# Patient Record
Sex: Male | Born: 1965 | Race: Black or African American | Hispanic: No | Marital: Single
Health system: Southern US, Community
[De-identification: ages and names within clinical notes are randomized; demographics above are authoritative.]

---

## 2004-06-11 ENCOUNTER — Ambulatory Visit: Payer: Self-pay | Admitting: Family Medicine

## 2004-06-16 ENCOUNTER — Ambulatory Visit: Payer: Self-pay | Admitting: Psychiatry

## 2004-06-22 ENCOUNTER — Ambulatory Visit: Payer: Self-pay | Admitting: Psychiatry

## 2006-03-04 ENCOUNTER — Ambulatory Visit: Payer: Self-pay | Admitting: Psychiatry

## 2006-03-05 ENCOUNTER — Ambulatory Visit: Payer: Self-pay | Admitting: Psychiatry

## 2006-12-21 ENCOUNTER — Ambulatory Visit: Payer: Self-pay | Admitting: Urology

## 2007-03-26 ENCOUNTER — Ambulatory Visit: Payer: Self-pay

## 2007-04-25 ENCOUNTER — Ambulatory Visit: Payer: Self-pay | Admitting: Gastroenterology

## 2007-06-12 ENCOUNTER — Other Ambulatory Visit: Payer: Self-pay

## 2007-06-12 ENCOUNTER — Emergency Department: Payer: Self-pay | Admitting: Emergency Medicine

## 2007-06-17 ENCOUNTER — Emergency Department: Payer: Self-pay | Admitting: Emergency Medicine

## 2007-08-06 ENCOUNTER — Emergency Department: Payer: Self-pay | Admitting: Emergency Medicine

## 2007-09-06 ENCOUNTER — Ambulatory Visit: Payer: Self-pay | Admitting: Family Medicine

## 2007-09-14 ENCOUNTER — Inpatient Hospital Stay: Payer: Self-pay | Admitting: Internal Medicine

## 2007-12-31 ENCOUNTER — Ambulatory Visit: Payer: Self-pay | Admitting: Urology

## 2008-01-15 ENCOUNTER — Ambulatory Visit: Payer: Self-pay | Admitting: Urology

## 2009-10-05 ENCOUNTER — Emergency Department: Payer: Self-pay | Admitting: Emergency Medicine

## 2009-11-08 IMAGING — CT CT HEAD WITHOUT CONTRAST
2 series · 15 of 30 positions shown, 19 images · non-contrast
Comparison: none

REASON FOR EXAM: Seizures
COMMENTS:

[Series 2: without · axial · non-contrast · 0.41mm/px · z∈[+672,+792]mm · 13 of 29 slices shown, 17 images]
[im 3/29  brain]
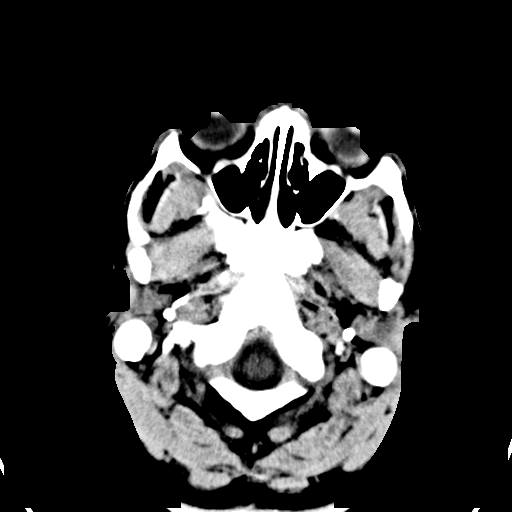
[im 3/29  bone]
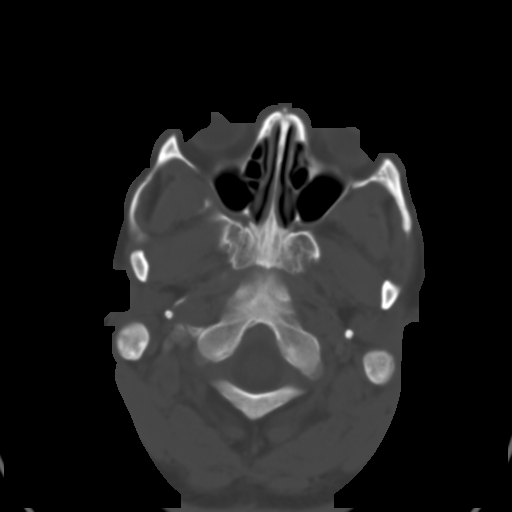
[im 5/29  brain]
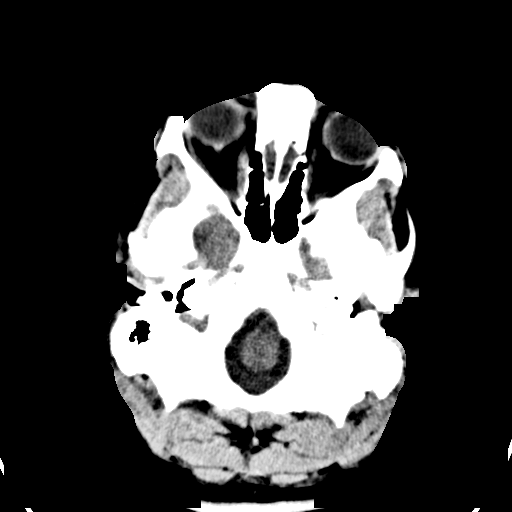
[im 7/29  brain]
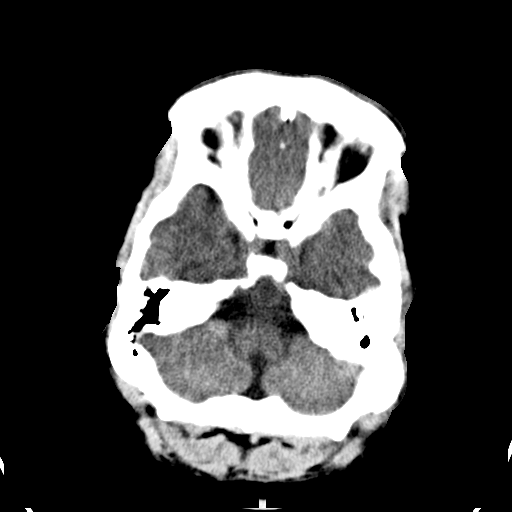
[im 9/29  brain]
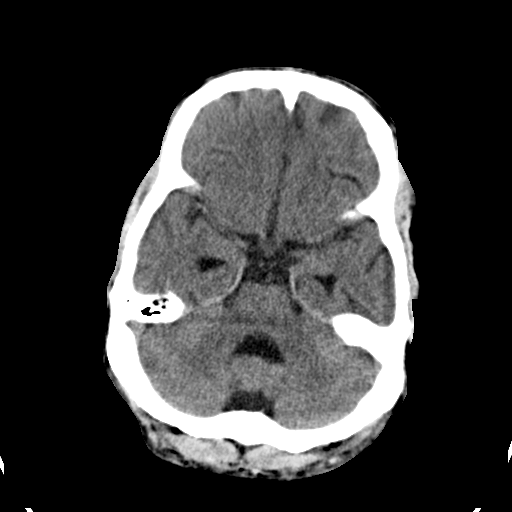
[im 11/29  brain]
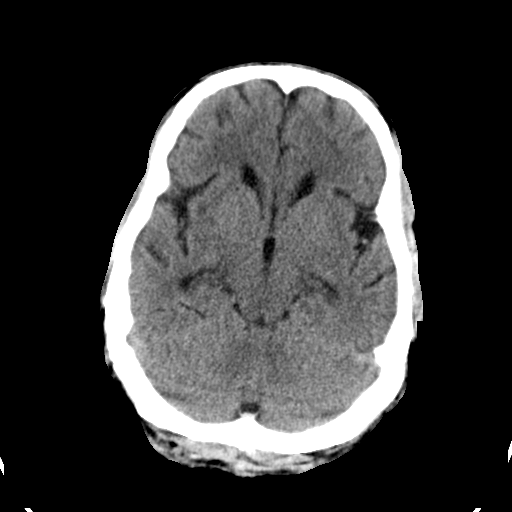
[im 11/29  bone]
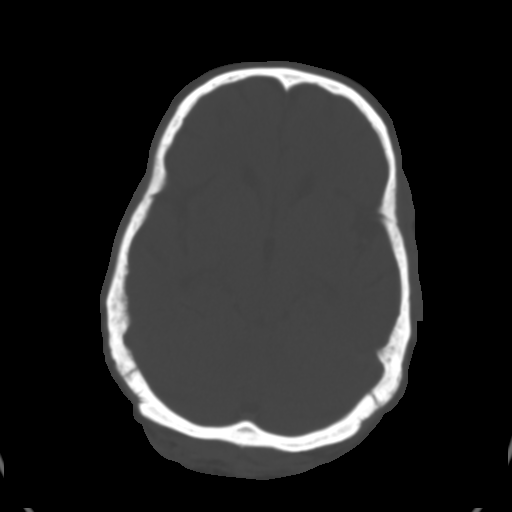
[im 13/29  brain]
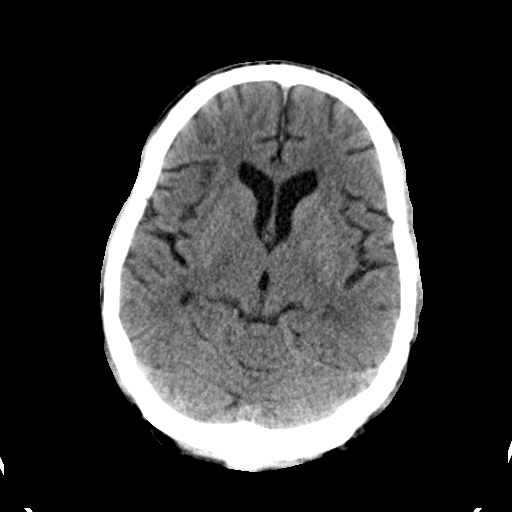
[im 15/29  brain]
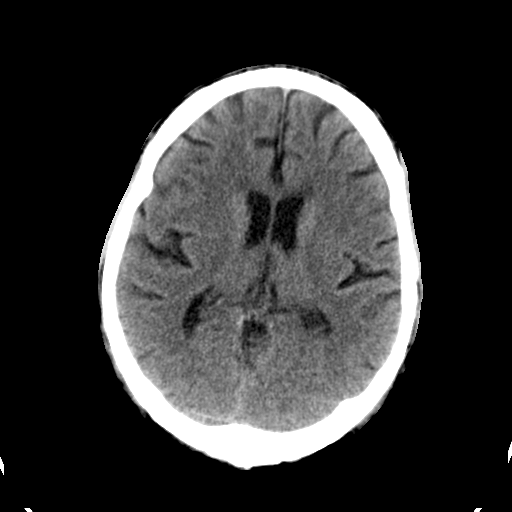
[im 17/29  brain]
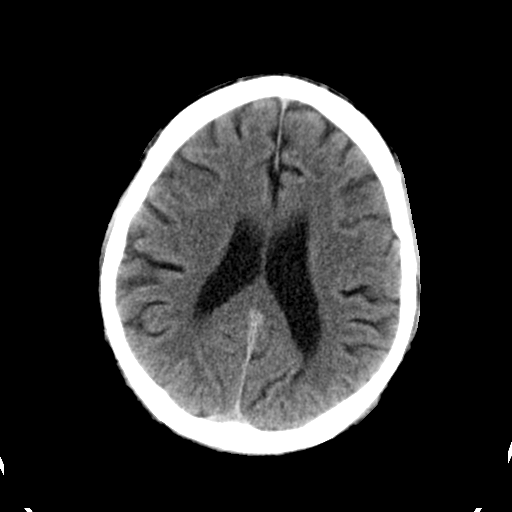
[im 19/29  brain]
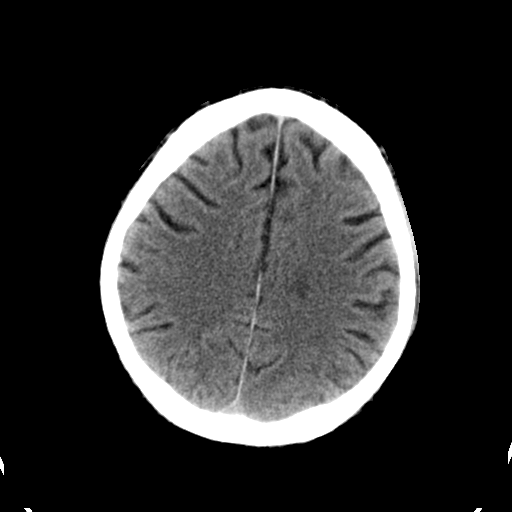
[im 19/29  bone]
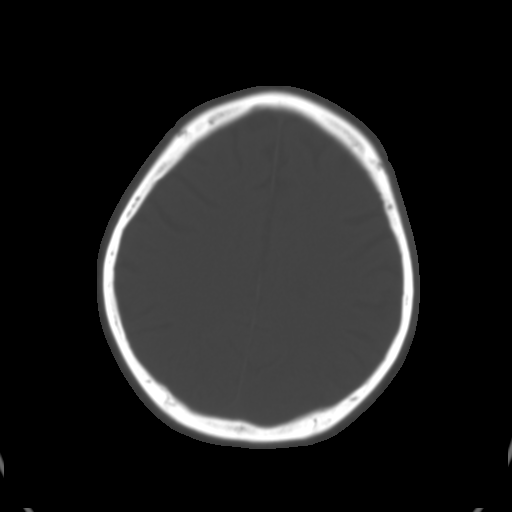
[im 21/29  brain]
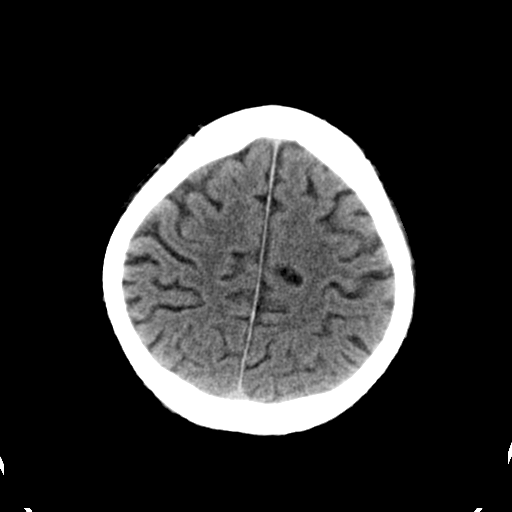
[im 23/29  brain]
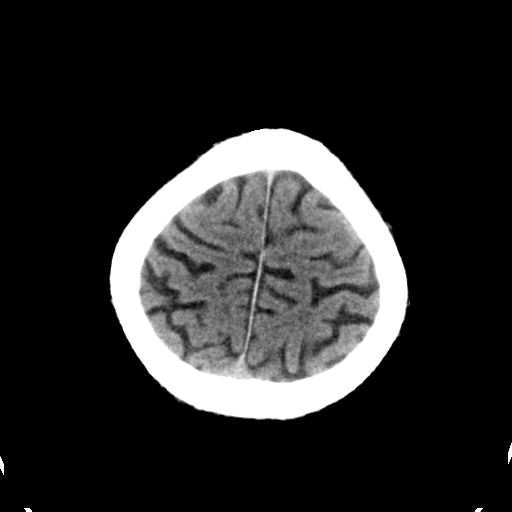
[im 25/29  brain]
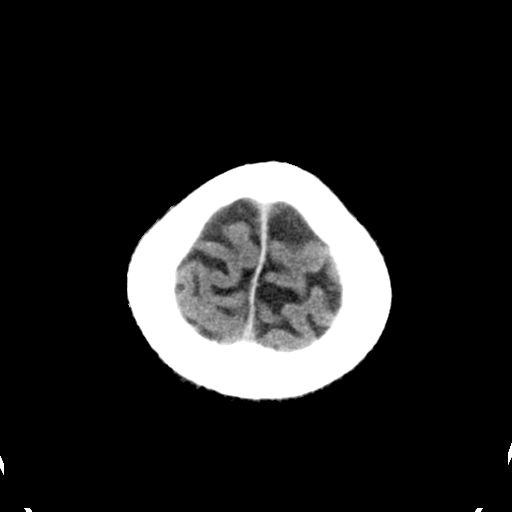
[im 27/29  brain]
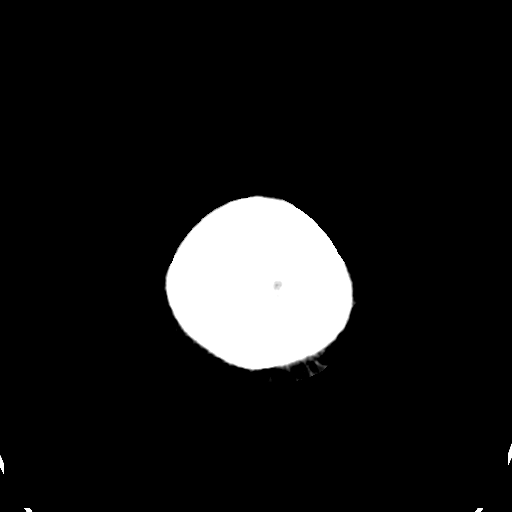
[im 27/29  bone]
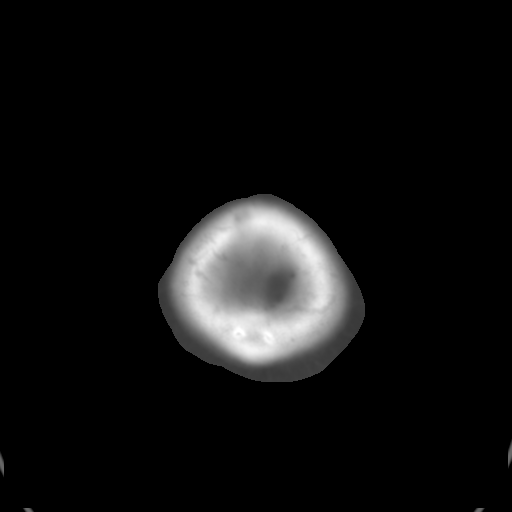

[Series 3: bone · axial · 0.41mm/px · z∈[+672,+692]mm · 2 of 29 slices shown]
[im 3/29  bone]
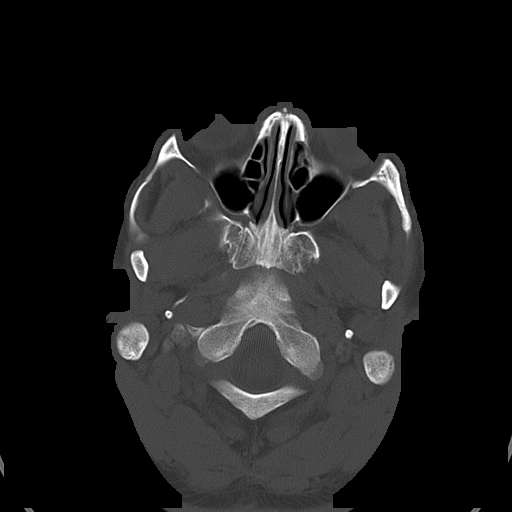
[im 7/29  bone]
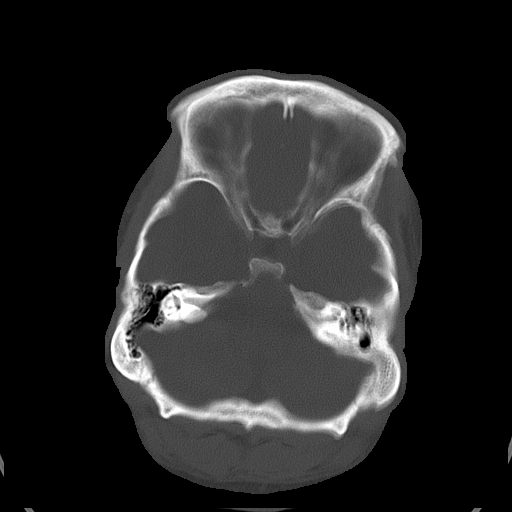

[15 of 30 positions shown; findings below may reference images not displayed]

PROCEDURE:     CT  - CT HEAD WITHOUT CONTRAST  - August 06, 2007 [DATE]

RESULT:     The ventricles are normal in size and position. There is no
evidence of an intracranial hemorrhage, mass or mass effect. There is focal
atrophy in the high LEFT parietal lobe in the paramedian region that is of
uncertain significance. This is unchanged since 06/12/2007. There are no
findings to indicate increased intracranial pressure.

At bone window settings, the calvarium appears intact. There is radiodense
material in the external auditory canal on the RIGHT. The mastoid air cells
on the RIGHT are well pneumatized but on the LEFT they are not nearly as
well pneumatized. The observed portions of the paranasal sinuses are clear.
IMPRESSION: 1.  I do not see evidence of acute intracranial abnormality. Given the
patient's history of seizure disorders, MRI may be a useful next step given
the findings in the high paramedian RIGHT parietal lobe which are not felt
to be acute.
2.  There is radiodense material in the external auditory canal on the RIGHT
which is not a new finding when compared to the study May 2007.
There is decreased aeration of the LEFT mastoid air cells which may be
congenital. There is no evidence of a skull fracture.

A preliminary report was sent to the [HOSPITAL] the conclusion
of the study.

## 2009-12-20 ENCOUNTER — Emergency Department: Payer: Self-pay | Admitting: Emergency Medicine

## 2010-01-13 ENCOUNTER — Emergency Department: Payer: Self-pay | Admitting: Emergency Medicine

## 2011-08-24 ENCOUNTER — Inpatient Hospital Stay: Payer: Self-pay | Admitting: Internal Medicine

## 2011-08-24 LAB — CK TOTAL AND CKMB (NOT AT ARMC): CK-MB: <0.5 ng/mL — ABNORMAL LOW (ref 0.5–3.6)

## 2011-08-24 LAB — COMPREHENSIVE METABOLIC PANEL
Anion Gap: 11 (ref 7–16)
Bilirubin,Total: 0.4 mg/dL (ref 0.2–1.0)
Calcium, Total: 7.9 mg/dL — ABNORMAL LOW (ref 8.5–10.1)
Chloride: 103 mmol/L (ref 98–107)
Co2: 29 mmol/L (ref 21–32)
Creatinine: 0.88 mg/dL (ref 0.60–1.30)
EGFR (African American): >60
EGFR (Non-African Amer.): >60
Osmolality: 287 (ref 275–301)
SGPT (ALT): 31 U/L
Sodium: 143 mmol/L (ref 136–145)

## 2011-08-24 LAB — CBC
HGB: 14 g/dL (ref 13.0–18.0)
MCH: 32.2 pg (ref 26.0–34.0)
MCHC: 32.5 g/dL (ref 32.0–36.0)
MCV: 99 fL (ref 80–100)
RBC: 4.35 10*6/uL — ABNORMAL LOW (ref 4.40–5.90)
RDW: 15.9 % — ABNORMAL HIGH (ref 11.5–14.5)
WBC: 14.4 10*3/uL — ABNORMAL HIGH (ref 3.8–10.6)

## 2011-08-24 LAB — PRO B NATRIURETIC PEPTIDE: B-Type Natriuretic Peptide: 14 pg/mL (ref 0–125)

## 2011-08-25 LAB — CBC WITH DIFFERENTIAL/PLATELET
Basophil #: 0 10*3/uL (ref 0.0–0.1)
Eosinophil %: 1.9 %
HCT: 39.5 % — ABNORMAL LOW (ref 40.0–52.0)
Lymphocyte #: 1 10*3/uL (ref 1.0–3.6)
MCHC: 32.8 g/dL (ref 32.0–36.0)
MCV: 99 fL (ref 80–100)
Neutrophil #: 10.4 10*3/uL — ABNORMAL HIGH (ref 1.4–6.5)
RDW: 15.9 % — ABNORMAL HIGH (ref 11.5–14.5)

## 2011-08-25 LAB — BASIC METABOLIC PANEL
BUN: 8 mg/dL (ref 7–18)
Chloride: 105 mmol/L (ref 98–107)
Co2: 30 mmol/L (ref 21–32)
Creatinine: 0.8 mg/dL (ref 0.60–1.30)
EGFR (Non-African Amer.): >60
Osmolality: 284 (ref 275–301)
Potassium: 3.7 mmol/L (ref 3.5–5.1)

## 2011-08-25 LAB — URINALYSIS, COMPLETE
Bilirubin,UR: NEGATIVE
Blood: NEGATIVE
Ketone: NEGATIVE
Leukocyte Esterase: NEGATIVE
Nitrite: NEGATIVE
Ph: 6 (ref 4.5–8.0)
Specific Gravity: 1.011 (ref 1.003–1.030)
Squamous Epithelial: NONE SEEN

## 2011-08-25 LAB — CK TOTAL AND CKMB (NOT AT ARMC)
CK, Total: 209 U/L (ref 35–232)
CK, Total: 234 U/L — ABNORMAL HIGH (ref 35–232)
CK-MB: <0.5 ng/mL — ABNORMAL LOW (ref 0.5–3.6)
CK-MB: <0.5 ng/mL — ABNORMAL LOW (ref 0.5–3.6)

## 2011-08-25 LAB — TROPONIN I
Troponin-I: <0.02 ng/mL
Troponin-I: <0.02 ng/mL

## 2011-08-26 LAB — BASIC METABOLIC PANEL
Anion Gap: 7 (ref 7–16)
Calcium, Total: 8.2 mg/dL — ABNORMAL LOW (ref 8.5–10.1)
Co2: 29 mmol/L (ref 21–32)
EGFR (African American): >60
EGFR (Non-African Amer.): >60

## 2011-08-26 LAB — CBC WITH DIFFERENTIAL/PLATELET
Basophil %: 0.3 %
Eosinophil #: 0.2 10*3/uL (ref 0.0–0.7)
Eosinophil %: 2.7 %
HCT: 38.2 % — ABNORMAL LOW (ref 40.0–52.0)
HGB: 12.5 g/dL — ABNORMAL LOW (ref 13.0–18.0)
Lymphocyte #: 0.9 10*3/uL — ABNORMAL LOW (ref 1.0–3.6)
MCH: 32.4 pg (ref 26.0–34.0)
MCV: 99 fL (ref 80–100)
Monocyte #: 0.7 10*3/uL (ref 0.0–0.7)
Monocyte %: 8.7 %
Neutrophil #: 5.8 10*3/uL (ref 1.4–6.5)
Neutrophil %: 76.8 %
RBC: 3.87 10*6/uL — ABNORMAL LOW (ref 4.40–5.90)
WBC: 7.6 10*3/uL (ref 3.8–10.6)

## 2011-08-28 LAB — CBC WITH DIFFERENTIAL/PLATELET
Basophil #: 0 10*3/uL (ref 0.0–0.1)
Eosinophil %: 0 %
HCT: 38.6 % — ABNORMAL LOW (ref 40.0–52.0)
Lymphocyte #: 1.3 10*3/uL (ref 1.0–3.6)
Lymphocyte %: 12.3 %
MCV: 98 fL (ref 80–100)
Monocyte #: 0.9 10*3/uL — ABNORMAL HIGH (ref 0.0–0.7)
Monocyte %: 8.4 %
Neutrophil #: 8.6 10*3/uL — ABNORMAL HIGH (ref 1.4–6.5)
Platelet: 281 10*3/uL (ref 150–440)
RBC: 3.95 10*6/uL — ABNORMAL LOW (ref 4.40–5.90)
WBC: 10.9 10*3/uL — ABNORMAL HIGH (ref 3.8–10.6)

## 2011-08-28 LAB — BASIC METABOLIC PANEL
BUN: 11 mg/dL (ref 7–18)
Calcium, Total: 8.3 mg/dL — ABNORMAL LOW (ref 8.5–10.1)
Co2: 23 mmol/L (ref 21–32)
EGFR (African American): >60
EGFR (Non-African Amer.): >60
Glucose: 113 mg/dL — ABNORMAL HIGH (ref 65–99)
Osmolality: 289 (ref 275–301)
Sodium: 145 mmol/L (ref 136–145)

## 2011-12-17 ENCOUNTER — Emergency Department: Payer: Self-pay | Admitting: *Deleted

## 2011-12-17 LAB — CBC
HCT: 45.9 % (ref 40.0–52.0)
HGB: 14.6 g/dL (ref 13.0–18.0)
MCH: 31.9 pg (ref 26.0–34.0)
MCV: 100 fL (ref 80–100)
Platelet: 238 10*3/uL (ref 150–440)
WBC: 6.3 10*3/uL (ref 3.8–10.6)

## 2011-12-17 LAB — URINALYSIS, COMPLETE
Bilirubin,UR: NEGATIVE
Blood: NEGATIVE
Glucose,UR: NEGATIVE mg/dL (ref 0–75)
Ketone: NEGATIVE
Leukocyte Esterase: NEGATIVE
Ph: 6 (ref 4.5–8.0)
RBC,UR: NONE SEEN /HPF (ref 0–5)
Specific Gravity: 1.028 (ref 1.003–1.030)
Squamous Epithelial: NONE SEEN
WBC UR: 1 /HPF (ref 0–5)

## 2011-12-17 LAB — COMPREHENSIVE METABOLIC PANEL
Alkaline Phosphatase: 65 U/L (ref 50–136)
BUN: 6 mg/dL — ABNORMAL LOW (ref 7–18)
Calcium, Total: 8.5 mg/dL (ref 8.5–10.1)
Creatinine: 0.72 mg/dL (ref 0.60–1.30)
EGFR (African American): >60
Potassium: 3.9 mmol/L (ref 3.5–5.1)
SGOT(AST): 30 U/L (ref 15–37)
SGPT (ALT): 41 U/L
Sodium: 142 mmol/L (ref 136–145)

## 2012-02-27 ENCOUNTER — Inpatient Hospital Stay: Payer: Self-pay | Admitting: Internal Medicine

## 2012-02-27 LAB — URINALYSIS, COMPLETE
Bilirubin,UR: NEGATIVE
Glucose,UR: NEGATIVE mg/dL (ref 0–75)
Ketone: NEGATIVE
Protein: NEGATIVE
RBC,UR: 5 /HPF (ref 0–5)
Renal Epithelial: 7
Squamous Epithelial: NONE SEEN
WBC UR: 1 /HPF (ref 0–5)

## 2012-02-27 LAB — CBC
HCT: 44.3 % (ref 40.0–52.0)
HGB: 14.5 g/dL (ref 13.0–18.0)
MCH: 32.3 pg (ref 26.0–34.0)
MCHC: 32.7 g/dL (ref 32.0–36.0)
MCV: 99 fL (ref 80–100)
RBC: 4.49 10*6/uL (ref 4.40–5.90)
RDW: 15.9 % — ABNORMAL HIGH (ref 11.5–14.5)
WBC: 7.5 10*3/uL (ref 3.8–10.6)

## 2012-02-27 LAB — COMPREHENSIVE METABOLIC PANEL
Albumin: 3 g/dL — ABNORMAL LOW (ref 3.4–5.0)
BUN: 11 mg/dL (ref 7–18)
EGFR (African American): >60
EGFR (Non-African Amer.): >60
Glucose: 94 mg/dL (ref 65–99)
SGOT(AST): 20 U/L (ref 15–37)
SGPT (ALT): 17 U/L
Total Protein: 8.3 g/dL — ABNORMAL HIGH (ref 6.4–8.2)

## 2012-02-27 LAB — TSH: Thyroid Stimulating Horm: 78.9 u[IU]/mL — ABNORMAL HIGH

## 2012-02-27 LAB — PRO B NATRIURETIC PEPTIDE: B-Type Natriuretic Peptide: 10 pg/mL (ref 0–125)

## 2012-02-28 LAB — CBC WITH DIFFERENTIAL/PLATELET
Basophil #: 0 10*3/uL (ref 0.0–0.1)
Lymphocyte %: 13 %
MCH: 32.3 pg (ref 26.0–34.0)
MCHC: 33.1 g/dL (ref 32.0–36.0)
Monocyte #: 0.5 x10 3/mm (ref 0.2–1.0)
Monocyte %: 8 %
Neutrophil #: 5.1 10*3/uL (ref 1.4–6.5)
Neutrophil %: 77.9 %
RDW: 15.6 % — ABNORMAL HIGH (ref 11.5–14.5)

## 2012-02-28 LAB — COMPREHENSIVE METABOLIC PANEL
Alkaline Phosphatase: 92 U/L (ref 50–136)
Anion Gap: 6 — ABNORMAL LOW (ref 7–16)
BUN: 9 mg/dL (ref 7–18)
Bilirubin,Total: 1 mg/dL (ref 0.2–1.0)
Creatinine: 0.96 mg/dL (ref 0.60–1.30)
EGFR (African American): >60
EGFR (Non-African Amer.): >60
Glucose: 122 mg/dL — ABNORMAL HIGH (ref 65–99)
SGOT(AST): 18 U/L (ref 15–37)
SGPT (ALT): 16 U/L
Total Protein: 8.4 g/dL — ABNORMAL HIGH (ref 6.4–8.2)

## 2012-02-28 LAB — URINALYSIS, COMPLETE
Leukocyte Esterase: NEGATIVE
Nitrite: NEGATIVE
Ph: 7 (ref 4.5–8.0)
Protein: NEGATIVE
Specific Gravity: 1.005 (ref 1.003–1.030)
WBC UR: 4 /HPF (ref 0–5)

## 2012-02-28 LAB — TROPONIN I
Troponin-I: <0.02 ng/mL
Troponin-I: <0.02 ng/mL

## 2012-02-29 LAB — POTASSIUM: Potassium: 3 mmol/L — ABNORMAL LOW

## 2012-02-29 LAB — URINE CULTURE

## 2012-03-01 LAB — POTASSIUM: Potassium: 3 mmol/L — ABNORMAL LOW (ref 3.5–5.1)

## 2012-03-04 LAB — CULTURE, BLOOD (SINGLE)

## 2012-08-22 ENCOUNTER — Emergency Department: Payer: Self-pay | Admitting: Emergency Medicine

## 2012-08-22 LAB — CBC
HCT: 42.3 % (ref 40.0–52.0)
HGB: 13.9 g/dL (ref 13.0–18.0)
MCH: 31.3 pg (ref 26.0–34.0)
MCV: 96 fL (ref 80–100)
Platelet: 307 10*3/uL (ref 150–440)
RBC: 4.42 10*6/uL (ref 4.40–5.90)
RDW: 15.9 % — ABNORMAL HIGH (ref 11.5–14.5)

## 2012-08-22 LAB — COMPREHENSIVE METABOLIC PANEL
Albumin: 2.6 g/dL — ABNORMAL LOW (ref 3.4–5.0)
Alkaline Phosphatase: 94 U/L (ref 50–136)
Anion Gap: 10 (ref 7–16)
BUN: 6 mg/dL — ABNORMAL LOW (ref 7–18)
Chloride: 108 mmol/L — ABNORMAL HIGH (ref 98–107)
Co2: 23 mmol/L (ref 21–32)
EGFR (African American): >60
Glucose: 94 mg/dL (ref 65–99)
Osmolality: 279 (ref 275–301)
SGOT(AST): 21 U/L (ref 15–37)
Sodium: 141 mmol/L (ref 136–145)

## 2012-08-22 LAB — PRO B NATRIURETIC PEPTIDE: B-Type Natriuretic Peptide: 5 pg/mL (ref 0–125)

## 2012-08-22 LAB — CK TOTAL AND CKMB (NOT AT ARMC): CK-MB: <0.5 ng/mL — ABNORMAL LOW (ref 0.5–3.6)

## 2013-09-05 ENCOUNTER — Observation Stay: Payer: Self-pay | Admitting: Internal Medicine

## 2013-09-05 LAB — URINALYSIS, COMPLETE
BLOOD: NEGATIVE
Bacteria: NONE SEEN
Bilirubin,UR: NEGATIVE
Glucose,UR: NEGATIVE mg/dL (ref 0–75)
Hyaline Cast: 1
Ketone: NEGATIVE
Leukocyte Esterase: NEGATIVE
NITRITE: NEGATIVE
PH: 6 (ref 4.5–8.0)
PROTEIN: NEGATIVE
RBC,UR: <1 /HPF (ref 0–5)
Specific Gravity: 1.012 (ref 1.003–1.030)
Squamous Epithelial: NONE SEEN
WBC UR: <1 /HPF (ref 0–5)

## 2013-09-05 LAB — COMPREHENSIVE METABOLIC PANEL
AST: 21 U/L (ref 15–37)
Albumin: 2.5 g/dL — ABNORMAL LOW (ref 3.4–5.0)
Alkaline Phosphatase: 87 U/L
Anion Gap: 2 — ABNORMAL LOW (ref 7–16)
BILIRUBIN TOTAL: 0.3 mg/dL (ref 0.2–1.0)
BUN: 7 mg/dL (ref 7–18)
CALCIUM: 8 mg/dL — AB (ref 8.5–10.1)
Chloride: 102 mmol/L (ref 98–107)
Co2: 30 mmol/L (ref 21–32)
Creatinine: 0.9 mg/dL (ref 0.60–1.30)
EGFR (African American): >60
Glucose: 73 mg/dL (ref 65–99)
OSMOLALITY: 265 (ref 275–301)
POTASSIUM: 3.7 mmol/L (ref 3.5–5.1)
SGPT (ALT): 16 U/L (ref 12–78)
Sodium: 134 mmol/L — ABNORMAL LOW (ref 136–145)
Total Protein: 7.9 g/dL (ref 6.4–8.2)

## 2013-09-05 LAB — CBC
HCT: 47 % (ref 40.0–52.0)
HGB: 15.3 g/dL (ref 13.0–18.0)
MCH: 31.6 pg (ref 26.0–34.0)
MCHC: 32.6 g/dL (ref 32.0–36.0)
MCV: 97 fL (ref 80–100)
Platelet: 285 10*3/uL (ref 150–440)
RBC: 4.84 10*6/uL (ref 4.40–5.90)
RDW: 15.6 % — AB (ref 11.5–14.5)
WBC: 9 10*3/uL (ref 3.8–10.6)

## 2013-09-05 LAB — PRO B NATRIURETIC PEPTIDE: B-Type Natriuretic Peptide: <5 pg/mL (ref 0–125)

## 2013-09-05 LAB — TROPONIN I: Troponin-I: <0.02 ng/mL

## 2013-09-06 LAB — CBC WITH DIFFERENTIAL/PLATELET
Basophil #: 0.1 10*3/uL (ref 0.0–0.1)
Basophil %: 1.1 %
EOS ABS: 0.1 10*3/uL (ref 0.0–0.7)
EOS PCT: 1.6 %
HCT: 40.2 % (ref 40.0–52.0)
HGB: 13.2 g/dL (ref 13.0–18.0)
LYMPHS ABS: 1 10*3/uL (ref 1.0–3.6)
LYMPHS PCT: 20.1 %
MCH: 31.6 pg (ref 26.0–34.0)
MCHC: 32.9 g/dL (ref 32.0–36.0)
MCV: 96 fL (ref 80–100)
MONO ABS: 0.6 x10 3/mm (ref 0.2–1.0)
Monocyte %: 11.1 %
Neutrophil #: 3.4 10*3/uL (ref 1.4–6.5)
Neutrophil %: 66.1 %
PLATELETS: 265 10*3/uL (ref 150–440)
RBC: 4.19 10*6/uL — ABNORMAL LOW (ref 4.40–5.90)
RDW: 15.4 % — AB (ref 11.5–14.5)
WBC: 5.1 10*3/uL (ref 3.8–10.6)

## 2013-09-06 LAB — BASIC METABOLIC PANEL
Anion Gap: 3 — ABNORMAL LOW (ref 7–16)
BUN: 7 mg/dL (ref 7–18)
CALCIUM: 8.1 mg/dL — AB (ref 8.5–10.1)
Chloride: 107 mmol/L (ref 98–107)
Co2: 29 mmol/L (ref 21–32)
Creatinine: 0.71 mg/dL (ref 0.60–1.30)
EGFR (African American): >60
Glucose: 77 mg/dL (ref 65–99)
OSMOLALITY: 274 (ref 275–301)
POTASSIUM: 3.9 mmol/L (ref 3.5–5.1)
SODIUM: 139 mmol/L (ref 136–145)

## 2013-09-06 LAB — MAGNESIUM: MAGNESIUM: 2 mg/dL

## 2013-09-06 LAB — TSH: Thyroid Stimulating Horm: 17.6 u[IU]/mL — ABNORMAL HIGH

## 2013-09-06 LAB — T4, FREE: FREE THYROXINE: 0.91 ng/dL (ref 0.76–1.46)

## 2013-09-10 LAB — CULTURE, BLOOD (SINGLE)

## 2013-09-11 LAB — CULTURE, BLOOD (SINGLE)

## 2013-09-28 ENCOUNTER — Inpatient Hospital Stay: Payer: Self-pay | Admitting: Internal Medicine

## 2013-09-28 LAB — CBC WITH DIFFERENTIAL/PLATELET
Basophil #: 0.1 10*3/uL (ref 0.0–0.1)
Basophil %: 0.7 %
Eosinophil #: 0 10*3/uL (ref 0.0–0.7)
Eosinophil %: 0.2 %
HCT: 45.8 % (ref 40.0–52.0)
HGB: 14.5 g/dL (ref 13.0–18.0)
Lymphocyte #: 0.9 10*3/uL — ABNORMAL LOW (ref 1.0–3.6)
Lymphocyte %: 10.1 %
MCH: 30.9 pg (ref 26.0–34.0)
MCHC: 31.7 g/dL — ABNORMAL LOW (ref 32.0–36.0)
MCV: 97 fL (ref 80–100)
Monocyte #: 0.6 x10 3/mm (ref 0.2–1.0)
Monocyte %: 6.9 %
NEUTROS ABS: 7.7 10*3/uL — AB (ref 1.4–6.5)
Neutrophil %: 82.1 %
Platelet: 302 10*3/uL (ref 150–440)
RBC: 4.7 10*6/uL (ref 4.40–5.90)
RDW: 15.6 % — AB (ref 11.5–14.5)
WBC: 9.3 10*3/uL (ref 3.8–10.6)

## 2013-09-28 LAB — URINALYSIS, COMPLETE
BACTERIA: NEGATIVE
Bilirubin,UR: NEGATIVE
Blood: NEGATIVE
GLUCOSE, UR: NEGATIVE mg/dL (ref 0–75)
Ketone: NEGATIVE
Leukocyte Esterase: NEGATIVE
Nitrite: NEGATIVE
Ph: 7 (ref 4.5–8.0)
Protein: NEGATIVE
RBC,UR: NONE SEEN /HPF (ref 0–5)
SPECIFIC GRAVITY: 1.016 (ref 1.003–1.030)
WBC UR: NONE SEEN /HPF (ref 0–5)

## 2013-09-28 LAB — COMPREHENSIVE METABOLIC PANEL
ALK PHOS: 103 U/L
Albumin: 2.5 g/dL — ABNORMAL LOW (ref 3.4–5.0)
Anion Gap: 5 — ABNORMAL LOW (ref 7–16)
BUN: 4 mg/dL — ABNORMAL LOW (ref 7–18)
Bilirubin,Total: 0.2 mg/dL (ref 0.2–1.0)
CALCIUM: 8.3 mg/dL — AB (ref 8.5–10.1)
CREATININE: 0.8 mg/dL (ref 0.60–1.30)
Chloride: 104 mmol/L (ref 98–107)
Co2: 30 mmol/L (ref 21–32)
EGFR (African American): >60
EGFR (Non-African Amer.): >60
GLUCOSE: 94 mg/dL (ref 65–99)
OSMOLALITY: 274 (ref 275–301)
Potassium: 3.6 mmol/L (ref 3.5–5.1)
SGOT(AST): 9 U/L — ABNORMAL LOW (ref 15–37)
SGPT (ALT): 11 U/L — ABNORMAL LOW (ref 12–78)
SODIUM: 139 mmol/L (ref 136–145)
TOTAL PROTEIN: 8 g/dL (ref 6.4–8.2)

## 2013-09-28 LAB — ETHANOL: Ethanol %: <0.003 % (ref 0.000–0.080)

## 2013-09-28 LAB — AMMONIA: Ammonia, Plasma: 43 mcmol/L — ABNORMAL HIGH (ref 11–32)

## 2013-09-28 LAB — DRUG SCREEN, URINE
Amphetamines, Ur Screen: NEGATIVE (ref ?–1000)
BARBITURATES, UR SCREEN: NEGATIVE (ref ?–200)
BENZODIAZEPINE, UR SCRN: NEGATIVE (ref ?–200)
Cannabinoid 50 Ng, Ur ~~LOC~~: NEGATIVE (ref ?–50)
Cocaine Metabolite,Ur ~~LOC~~: NEGATIVE (ref ?–300)
MDMA (Ecstasy)Ur Screen: POSITIVE (ref ?–500)
METHADONE, UR SCREEN: NEGATIVE (ref ?–300)
Opiate, Ur Screen: NEGATIVE (ref ?–300)
Phencyclidine (PCP) Ur S: NEGATIVE (ref ?–25)
Tricyclic, Ur Screen: NEGATIVE (ref ?–1000)

## 2013-09-28 LAB — MAGNESIUM: MAGNESIUM: 2.2 mg/dL

## 2013-09-28 LAB — LIPASE, BLOOD: Lipase: 78 U/L (ref 73–393)

## 2013-09-28 LAB — TROPONIN I: Troponin-I: <0.02 ng/mL

## 2013-09-29 LAB — CBC WITH DIFFERENTIAL/PLATELET
Basophil #: 0 10*3/uL (ref 0.0–0.1)
Basophil %: 0.4 %
Eosinophil #: 0 10*3/uL (ref 0.0–0.7)
Eosinophil %: 0.1 %
HCT: 46.1 % (ref 40.0–52.0)
HGB: 14.8 g/dL (ref 13.0–18.0)
LYMPHS PCT: 6.5 %
Lymphocyte #: 0.8 10*3/uL — ABNORMAL LOW (ref 1.0–3.6)
MCH: 31.5 pg (ref 26.0–34.0)
MCHC: 32.2 g/dL (ref 32.0–36.0)
MCV: 98 fL (ref 80–100)
MONO ABS: 0.7 x10 3/mm (ref 0.2–1.0)
MONOS PCT: 6 %
Neutrophil #: 10.6 10*3/uL — ABNORMAL HIGH (ref 1.4–6.5)
Neutrophil %: 87 %
PLATELETS: 317 10*3/uL (ref 150–440)
RBC: 4.71 10*6/uL (ref 4.40–5.90)
RDW: 15.5 % — ABNORMAL HIGH (ref 11.5–14.5)
WBC: 12.2 10*3/uL — AB (ref 3.8–10.6)

## 2013-09-29 LAB — CK-MB: CK-MB: <0.5 ng/mL — ABNORMAL LOW (ref 0.5–3.6)

## 2013-09-29 LAB — COMPREHENSIVE METABOLIC PANEL
ALK PHOS: 103 U/L
ANION GAP: 4 — AB (ref 7–16)
AST: 13 U/L — AB (ref 15–37)
Albumin: 2.6 g/dL — ABNORMAL LOW (ref 3.4–5.0)
BUN: 4 mg/dL — ABNORMAL LOW (ref 7–18)
Bilirubin,Total: 0.5 mg/dL (ref 0.2–1.0)
CHLORIDE: 103 mmol/L (ref 98–107)
Calcium, Total: 8.5 mg/dL (ref 8.5–10.1)
Co2: 30 mmol/L (ref 21–32)
Creatinine: 0.88 mg/dL (ref 0.60–1.30)
EGFR (Non-African Amer.): >60
GLUCOSE: 115 mg/dL — AB (ref 65–99)
OSMOLALITY: 272 (ref 275–301)
Potassium: 3.4 mmol/L — ABNORMAL LOW (ref 3.5–5.1)
SGPT (ALT): 10 U/L — ABNORMAL LOW (ref 12–78)
SODIUM: 137 mmol/L (ref 136–145)
Total Protein: 8.3 g/dL — ABNORMAL HIGH (ref 6.4–8.2)

## 2013-09-29 LAB — TROPONIN I
Troponin-I: <0.02 ng/mL
Troponin-I: <0.02 ng/mL

## 2013-10-03 LAB — CULTURE, BLOOD (SINGLE)

## 2013-10-03 LAB — PLATELET COUNT: Platelet: 258 10*3/uL (ref 150–440)

## 2013-10-05 LAB — CREATININE, SERUM
Creatinine: 0.95 mg/dL (ref 0.60–1.30)
EGFR (African American): >60
EGFR (Non-African Amer.): >60

## 2013-10-07 ENCOUNTER — Emergency Department: Payer: Self-pay | Admitting: Emergency Medicine

## 2013-10-07 LAB — DRUG SCREEN, URINE
Amphetamines, Ur Screen: NEGATIVE (ref ?–1000)
Barbiturates, Ur Screen: NEGATIVE (ref ?–200)
Benzodiazepine, Ur Scrn: POSITIVE (ref ?–200)
CANNABINOID 50 NG, UR ~~LOC~~: NEGATIVE (ref ?–50)
Cocaine Metabolite,Ur ~~LOC~~: NEGATIVE (ref ?–300)
MDMA (ECSTASY) UR SCREEN: POSITIVE (ref ?–500)
METHADONE, UR SCREEN: NEGATIVE (ref ?–300)
Opiate, Ur Screen: POSITIVE (ref ?–300)
Phencyclidine (PCP) Ur S: NEGATIVE (ref ?–25)
TRICYCLIC, UR SCREEN: NEGATIVE (ref ?–1000)

## 2013-10-07 LAB — COMPREHENSIVE METABOLIC PANEL
ALK PHOS: 62 U/L
ANION GAP: 3 — AB (ref 7–16)
Albumin: 2.6 g/dL — ABNORMAL LOW (ref 3.4–5.0)
BUN: 9 mg/dL (ref 7–18)
Bilirubin,Total: 0.5 mg/dL (ref 0.2–1.0)
CALCIUM: 8.6 mg/dL (ref 8.5–10.1)
Chloride: 107 mmol/L (ref 98–107)
Co2: 28 mmol/L (ref 21–32)
Creatinine: 0.65 mg/dL (ref 0.60–1.30)
GLUCOSE: 76 mg/dL (ref 65–99)
Osmolality: 273 (ref 275–301)
Potassium: 5.1 mmol/L (ref 3.5–5.1)
SGOT(AST): 45 U/L — ABNORMAL HIGH (ref 15–37)
SGPT (ALT): 17 U/L (ref 12–78)
Sodium: 138 mmol/L (ref 136–145)
TOTAL PROTEIN: 8.5 g/dL — AB (ref 6.4–8.2)

## 2013-10-07 LAB — URINALYSIS, COMPLETE
Bacteria: NONE SEEN
Glucose,UR: NEGATIVE mg/dL (ref 0–75)
KETONE: NEGATIVE
Leukocyte Esterase: NEGATIVE
Nitrite: NEGATIVE
Ph: 6 (ref 4.5–8.0)
Protein: NEGATIVE
Specific Gravity: 1.026 (ref 1.003–1.030)
Squamous Epithelial: <1
WBC UR: 2 /HPF (ref 0–5)

## 2013-10-07 LAB — CBC WITH DIFFERENTIAL/PLATELET
BASOS ABS: 0.1 10*3/uL (ref 0.0–0.1)
Basophil %: 1.7 %
EOS ABS: 0.1 10*3/uL (ref 0.0–0.7)
EOS PCT: 2.1 %
HCT: 44.5 % (ref 40.0–52.0)
HGB: 14.8 g/dL (ref 13.0–18.0)
LYMPHS PCT: 26.3 %
Lymphocyte #: 1.7 10*3/uL (ref 1.0–3.6)
MCH: 32.1 pg (ref 26.0–34.0)
MCHC: 33.2 g/dL (ref 32.0–36.0)
MCV: 97 fL (ref 80–100)
MONO ABS: 0.6 x10 3/mm (ref 0.2–1.0)
MONOS PCT: 10.2 %
NEUTROS PCT: 59.7 %
Neutrophil #: 3.8 10*3/uL (ref 1.4–6.5)
Platelet: 325 10*3/uL (ref 150–440)
RBC: 4.59 10*6/uL (ref 4.40–5.90)
RDW: 16.3 % — ABNORMAL HIGH (ref 11.5–14.5)
WBC: 6.3 10*3/uL (ref 3.8–10.6)

## 2013-10-19 ENCOUNTER — Ambulatory Visit: Payer: Self-pay | Admitting: Nurse Practitioner

## 2013-12-19 DEATH — deceased

## 2014-12-12 NOTE — Consult Note (Signed)
Referring Physician:  Theodoro Grist :   Primary Care Physician:  Rejeana Brock, [address], [place], [place] [address], [place] [phone]  Reason for Consult: Admit Date: 28-Sep-2013  Chief Complaint: seizure  Reason for Consult: seizure   History of Present Illness: History of Present Illness:   49 year old man with down syndrome and seizures presented due to unresponsiveness.  Apparently had witnessed seizure by EMS and also again in the ED.  It was thought that he aspirated recently and this led to poor respiratory function and decreased O2 levels.  CXR in ED showed PNA.  He was given Ativan in the ED after possible witnessed seizure and has been somewhat lethargic since that time.  No clear seizures since arriving in the ICU.  On IV Keppra right now.   REVIEW OF SYSTEMS: Cannot obtain since patient intubated. MEDICAL HISTORY: Significant for history of admission in January 2015 for hypertension as well as hypoxia episode, which was felt to be due to a lot of secretions. No source of infection was identified. Also history of seizure disorder, Down syndrome, history of mental status abnormalities, altered mental status, as well as drowsiness on presentation due to hypoxia, which was felt to be due to hypoxia as well as possibly hypotension. Past medical history is also significant for history of hypothyroidism, history of respiratory failure in the past, pneumonia in the past, as well as sepsis. He was admitted at least twice for acute respiratory failure in the past, which was suspected aspiration pneumonia.  SURGICAL: Cannot obtain since patient intubated. HISTORY: Cannot obtain since patient intubated. HISTORY: No smoking, no alcohol or illicit drugs. Nursing home resident.  137 mcg p.o. daily, Flonase nasal spray 50 mcg inhalation to each nostril daily, Effexor-XR 75 mg p.o. daily, lactulose 1 tablespoon twice daily, ranitidine 150 mg half tablet twice daily, Keppra 500  mg p.o. twice daily, Seroquel 100 mg p.o. daily at bedtime, Trazodone 50 mg 3 tablets p.o. at bedtime, Mucinex ER 600 mg p.o. twice daily as needed. Also some other medication which I cannot read, to be applied twice daily as needed for rash. for rash to be applied twice daily as needed for rash. Sulfa drugs, Dilantin   (Removed):              Past Medical/Surgical Hx:  Aspiration Pneumonia:   dysphagia: minimal at group home per report - pt was having his food "chopped"  Constipation:   Hypothyroidism:   Seizures: had one seizure 2 years ago and never had any more  Allergic Rhinitis: Allergic Rhinitis  Down's Syndrome:   Home Medications: Medication Instructions Last Modified Date/Time  Effexor XR 75 mg oral capsule, extended release 1 cap(s) orally once a day. *do not crush* 08-Feb-15 22:40  fluticasone 50 mcg/inh nasal spray 1 spray(s) in each nostril once a day 08-Feb-15 22:40  levETIRAcetam 500 mg oral tablet 1 tab(s) orally 2 times a day 08-Feb-15 22:40  lactulose 10 g/15 mL oral syrup 1 tablespoonful (15 milliliters) orally 2 times a day 08-Feb-15 22:40  levothyroxine 137 mcg (0.137 mg) oral tablet 1 tab(s) orally once a day 08-Feb-15 22:40  ranitidine 150 mg oral tablet 0.5 tab (59m) orally 2 times a day 08-Feb-15 22:40  QUEtiapine 100 mg oral tablet 1 tab(s) orally once a day (at bedtime) 08-Feb-15 22:40  traZODone 50 mg oral tablet 3 tabs (1564m orally once a day (at bedtime) 08-Feb-15 22:40  Mucinex 600 mg oral tablet, extended release 1  tab(s) orally 2 times a day as needed for cough. 08-Feb-15 22:40  Hydrocortisone 1% In Absorbase 1% topical ointment Apply topically to affected area 2 times a day, As Needed for rash. 08-Feb-15 22:40   [facility] Current Meds:  Sodium Chloride 0.9%, 1000 ml at 40 ml/hr  HePARin injection, 5000 unit(s), Subcutaneous, q8h  Indication: Anticoagulant, Monitor Anticoags per hospital protocol  Ondansetron injection, ( Zofran injection )  4 mg,  IV push, q4h PRN for Nausea/Vomiting  Indication: Nausea/ Vomiting  Promethazine injection, ( Phenergan injection )  6.25 to 12.5 mg, Intramuscular, q4h PRN for nausea, vomiting  Indication: Antiemetic/ Motion Sickness/ Sedative/ Antihistamine  Sodium Chloride 0.9% injection, 3 ml, IV push, q6h  Piperacillin-Tazobactam injection,  ( Zosyn )  3.375 gram, IV Piggyback, q8h, Infuse over 4 hour(s)  Indication: Infection  Pharmacy dose per Zosyn Ext Inf Protocol, <<<Extended infusion protocol>>>  Glycopyrrolate injection, 0.2 mg, IV push, q8h  Indication: Decreased Secretions Pre-Surgery/Reversal Neuromuscular Bloc/GI Disorders, [Waste Code: SendToRx]  Levothyroxine injection,  ( Synthroid injection )  60 mcg, IV push, daily  Indication: Hormone supplement or replacement in hypothyroidism, <<Dilute vial with 5 mL normal saline; use immediately after reconstitution>>  levETIRAcetam injection,  ( Keppra injection )  500 mg in Dextrose 5% 100 ml, IV Piggyback, q12h, Infuse over 15 minute(s), Mixed in 100 ml given over 15 minutes.  Pneumococcal 23-valent Vaccine, 0.5 ml, Intramuscular, once  Indication: Pneumococcal Immunization, 0.10m IM once (Stored in Pyxis Refrigerator)  Influenza Virus Quadrivalent Vaccine injection, 0.5 ml, Intramuscular, GivenOnce  Indication: provide Active Immunity to Influenza Strains contained in Vaccine, ***The patient must have a temperature of 100.5 or less, anything greater the patient needs to be afebrile x 24 hours before administration***, **Latex Free**  Vancomycin  injection,  ( Vancocin injection )  750 mg in Dextrose 5% 250 ml, IV Piggyback, q12h, Infuse over 60 minute(s)  Indication: Infection, Timed to start 7 hours after first vancomycin dose for stacked dosing. Vancomycin started 2/8  Line Flush - Normal Saline, 5 ml, IV push, daily  Indication: Line patency, Flush each UNUSED port with 5 ml saline, 5 ml to each unused lumen daily  Line Flush - Normal  Saline, 5 to 10 ml, IV push, ud PRN for line patency, Flush each lumen with 5 ml before and 10 ml after each med admin, TPN, blood draw or blood administration.  Line Flush Heparin 10 units/ml injection, 5 ml, IV push, ud PRN for line patency  Indication: Line Patency, Monitor Anticoags per hospital protocol, Flush lumen with 50 units after med admin, TPN, blood draws or blood administration.  Line Flush Heparin 10 units/ml injection, 5 ml, IV push, daily  Indication: Line Patency, Monitor Anticoags per hospital protocol, Flush each UNUSED port with 50 units Heparin every 24 hours., Flush each unused lumen with 50 units every 24 hours.  Levofloxacin injection,  ( Levaquin injection )  750 mg, IV Piggyback, q24h, Infuse over 90 minute(s)  Indication: Infection  Allergies:  Sulfa drugs: Rash  Dilantin: Rash  Vital Signs: **Vital Signs.:   09-Feb-15 14:00  Pulse Pulse 94  Respirations Respirations 15  Systolic BP Systolic BP 94  Diastolic BP (mmHg) Diastolic BP (mmHg) 75  Mean BP 81  Pulse Ox % Pulse Ox % 95  Pulse Ox Activity Level  At rest  Oxygen Delivery 3L; Nasal Cannula; pt. asleep  Pulse Ox Heart Rate 94   EXAM: GENERAL: Intubated, no sedation, no spontaneous movements or sounds.  EYES:  Doll's eyes intact, does not blink to threat, pupils are reactive, corneals intact.  Funduscopic exam without clear evidence of papilledema.  CARDIOVASCULAR: S1 and S2 sounds are within normal limits, without murmurs, gallops, or rubs.  MUSCULOSKELETAL: Bulk - Normal Tone - Decreased Pronator Drift - Minimally responsive, unable to test. Ambulation - Minimally responsive, unable to test. Romberg - Minimally responsive, unable to test. Strength - Minimally responsive, unable to test, no spontaneous movements.  No withdraw, localization, flexion or extension with noxious stim.  NEUROLOGICAL: MENTAL STATUS: Eyes closed, minimally responsive.  CRANIAL NERVES: Normal    CN II - Pupils  are reactive to light. Normal    CN III, IV, VI Doll's eyes are intact. Normal    CN V Minimally responsive, unable to test. Normal    CN VII Minimally responsive, unable to test. Normal    CN VIII Minimally responsive, unable to test. Normal    CN IX/X Cough and gag are intact. Normal    CN XI Minimally responsive, unable to test. Normal    CN XII Minimally responsive, unable to test.  SENSATION: Does not grimace to noxious stim in any extremity.      REFLEXES: Trace throughout in the bilateral upper and lower extremities.   COORDINATION/CEREBELLAR: Minimally responsive, unable to test..  Lab Results:  Hepatic:  08-Feb-15 18:45   Bilirubin, Total 0.2  Alkaline Phosphatase 103 (45-117 NOTE: New Reference Range 07/11/13)  SGPT (ALT)  11  SGOT (AST)  9  Total Protein, Serum 8.0  Albumin, Serum  2.5  09-Feb-15 02:57   Bilirubin, Total 0.5  Alkaline Phosphatase 103 (45-117 NOTE: New Reference Range 07/11/13)  SGPT (ALT)  10  SGOT (AST)  13  Total Protein, Serum  8.3  Albumin, Serum  2.6  Routine Micro:  08-Feb-15 18:55   Micro Text Report BLOOD CULTURE   COMMENT                   NO GROWTH IN 8-12 HOURS   ANTIBIOTIC                       Culture Comment NO GROWTH IN 8-12 HOURS  Result(s) reported on 29 Sep 2013 at 02:00AM.    19:55   Micro Text Report BLOOD CULTURE   COMMENT                   NO GROWTH IN 8-12 HOURS   ANTIBIOTIC                       Culture Comment NO GROWTH IN 8-12 HOURS  Result(s) reported on 29 Sep 2013 at 03:00AM.  Lab:  08-Feb-15 18:30   pH (ABG) 7.41  PCO2  51  PO2  65  Base Excess  6.3  HCO3  32.3  O2 Saturation 95.4  O2 Device room air  Specimen Site (ABG) RT RADIAL  Specimen Type (ABG) ARTERIAL  Patient Temp (ABG) 37.0  %FiO2 21.0 (Result(s) reported on 28 Sep 2013 at 06:42PM.)  Lactic Acid, Cardiopulmonary 1.3 (Result(s) reported on 28 Sep 2013 at 06:42PM.)  Routine Chem:  08-Feb-15 18:45   Glucose, Serum 94  BUN  4   Creatinine (comp) 0.80  Sodium, Serum 139  Potassium, Serum 3.6  Chloride, Serum 104  CO2, Serum 30  Calcium (Total), Serum  8.3  Osmolality (calc) 274  eGFR (African American) >60  eGFR (Non-African American) >60 (eGFR values <64m/min/1.73 m2 may be  an indication of chronic kidney disease (CKD). Calculated eGFR is useful in patients with stable renal function. The eGFR calculation will not be reliable in acutely ill patients when serum creatinine is changing rapidly. It is not useful in  patients on dialysis. The eGFR calculation may not be applicable to patients at the low and high extremes of body sizes, pregnant women, and vegetarians.)  Anion Gap  5  Ethanol, S. < 3  Ethanol % (comp) < 0.003 (Result(s) reported on 28 Sep 2013 at 08:32PM.)  Result Comment CBC - SPECIMEN CLOTTED  - KLS TO KIM GAULT '@[id number]'  09/28/13  Result(s) reported on 28 Sep 2013 at 06:59PM.  Lipase 78 (Result(s) reported on 28 Sep 2013 at 08:32PM.)  Magnesium, Serum 2.2 (1.8-2.4 THERAPEUTIC RANGE: 4-7 mg/dL TOXIC: > 10 mg/dL  -----------------------)    22:25   Ammonia, Plasma  43 (Result(s) reported on 28 Sep 2013 at 11:00PM.)  09-Feb-15 02:57   Glucose, Serum  115  BUN  4  Creatinine (comp) 0.88  Sodium, Serum 137  Potassium, Serum  3.4  Chloride, Serum 103  CO2, Serum 30  Calcium (Total), Serum 8.5  Osmolality (calc) 272  eGFR (African American) >60  eGFR (Non-African American) >60 (eGFR values <30m/min/1.73 m2 may be an indication of chronic kidney disease (CKD). Calculated eGFR is useful in patients with stable renal function. The eGFR calculation will not be reliable in acutely ill patients when serum creatinine is changing rapidly. It is not useful in  patients on dialysis. The eGFR calculation may not be applicable to patients at the low and high extremes of body sizes, pregnant women, and vegetarians.)  Anion Gap  4  Urine Drugs:  [id number]  Tricyclic Antidepressant, Ur Qual  (comp) NEGATIVE (Result(s) reported on 28 Sep 2013 at 10:29PM.)  Amphetamines, Urine Qual. NEGATIVE  MDMA, Urine Qual. POSITIVE  Cocaine Metabolite, Urine Qual. NEGATIVE  Opiate, Urine qual NEGATIVE  Phencyclidine, Urine Qual. NEGATIVE  Cannabinoid, Urine Qual. NEGATIVE  Barbiturates, Urine Qual. NEGATIVE  Benzodiazepine, Urine Qual. NEGATIVE (----------------- The URINE DRUG SCREEN provides only a preliminary, unconfirmed analytical test result and should not be used for non-medical  purposes.  Clinical consideration and professional judgment should be  applied to any positive drug screen result due to possible interfering substances.  A more specific alternate chemical method must be used in order to obtain a confirmed analytical result.  Gas chromatography/mass spectrometry (GC/MS) is the preferred confirmatory method.)  Methadone, Urine Qual. NEGATIVE  Cardiac:  08-Feb-15 18:45   Troponin I < 0.02 (0.00-0.05 0.05 ng/mL or less: NEGATIVE  Repeat testing in 3-6 hrs  if clinically indicated. >0.05 ng/mL: POTENTIAL  MYOCARDIAL INJURY. Repeat  testing in 3-6 hrs if  clinically indicated. NOTE: An increase or decrease  of 30% or more on serial  testing suggests a  clinically important change)  09-Feb-15 00:00   CPK-MB, Serum  < 0.5 (Result(s) reported on 29 Sep 2013 at 12:49AM.)  Troponin I < 0.02 (0.00-0.05 0.05 ng/mL or less: NEGATIVE  Repeat testing in 3-6 hrs  if clinically indicated. >0.05 ng/mL: POTENTIAL  MYOCARDIAL INJURY. Repeat  testing in 3-6 hrs if  clinically indicated. NOTE: An increase or decrease  of 30% or more on serial  testing suggests a  clinically important change)    02:57   CPK-MB, Serum  < 0.5 (Result(s) reported on 29 Sep 2013 at 03:55AM.)  Troponin I < 0.02 (0.00-0.05 0.05 ng/mL or less: NEGATIVE  Repeat testing in 3-6 hrs  if clinically indicated. >0.05 ng/mL: POTENTIAL  MYOCARDIAL INJURY. Repeat  testing in 3-6 hrs if  clinically  indicated. NOTE: An increase or decrease  of 30% or more on serial  testing suggests a  clinically important change)  Routine UA:  08-Feb-15 21:10   Color (UA) Yellow  Clarity (UA) Clear  Glucose (UA) Negative  Bilirubin (UA) Negative  Ketones (UA) Negative  Specific Gravity (UA) 1.016  Blood (UA) Negative  pH (UA) 7.0  Protein (UA) Negative  Nitrite (UA) Negative  Leukocyte Esterase (UA) Negative (Result(s) reported on 28 Sep 2013 at 10:17PM.)  RBC (UA) NONE SEEN  WBC (UA) NONE SEEN  Bacteria (UA) NEGATIVE  Mucous (UA) PRESENT (Result(s) reported on 28 Sep 2013 at 10:17PM.)  Routine Hem:  08-Feb-15 18:45   WBC (CBC) -  RBC (CBC) -  Hemoglobin (CBC) -  Hematocrit (CBC) -  Platelet Count (CBC) -  MCV -  MCH -  MCHC -  RDW -  Neutrophil % -  Lymphocyte % -  Monocyte % -  Eosinophil % -  Basophil % -  Neutrophil # -  Lymphocyte # -  Monocyte # -  Eosinophil # -  Basophil # -  Bands -  Segmented Neutrophils -  Lymphocytes -  Variant Lymphocytes -  Monocytes -  Eosinophil -  Basophil -  Metamyelocyte -  Myelocyte -  Promyelocyte -  Blast-Like -  Other Cells -  NRBC -  Diff Comment 1 -  Diff Comment 2 -  Diff Comment 3 -  Diff Comment 4 -  Diff Comment 5 -  Diff Comment 6 -  Diff Comment 7 -  Diff Comment 8 -  Diff Comment 9 -  Diff Comment 10 - (Result(s) reported on 28 Sep 2013 at 06:59PM.)    19:55   WBC (CBC) 9.3  RBC (CBC) 4.70  Hemoglobin (CBC) 14.5  Hematocrit (CBC) 45.8  Platelet Count (CBC) 302  MCV 97  MCH 30.9  MCHC  31.7  RDW  15.6  Neutrophil % 82.1  Lymphocyte % 10.1  Monocyte % 6.9  Eosinophil % 0.2  Basophil % 0.7  Neutrophil #  7.7  Lymphocyte #  0.9  Monocyte # 0.6  Eosinophil # 0.0  Basophil # 0.1 (Result(s) reported on 28 Sep 2013 at 08:26PM.)  09-Feb-15 02:57   WBC (CBC)  12.2  RBC (CBC) 4.71  Hemoglobin (CBC) 14.8  Hematocrit (CBC) 46.1  Platelet Count (CBC) 317  MCV 98  MCH 31.5  MCHC 32.2  RDW  15.5   Neutrophil % 87.0  Lymphocyte % 6.5  Monocyte % 6.0  Eosinophil % 0.1  Basophil % 0.4  Neutrophil #  10.6  Lymphocyte #  0.8  Monocyte # 0.7  Eosinophil # 0.0  Basophil # 0.0 (Result(s) reported on 29 Sep 2013 at 04:18AM.)   Impression/Recommendations: Recommendations:   49 year old man with down syndrome and seizures presented due to unresponsiveness.   with recent witnessed seizures.  Agree with continue on Keppra.  Lethargy seen now likely secondary effect of ativan or may represent post ictal state being slow to clear given his diminished cognitive capacity at baseline.  Limit sedating meds.  Rec routine EEG to rule out nonconvulsive status epilepticus.  Consider HCT if symptoms do not begin to improve.  Cognition also likely affected by pneumonia.   have reviewed the results of the most recent tests and labs as outlined above and answered all related questions. and coordinated plan of  care with hospitalist.     Electronic Signatures: Anabel Bene (MD)  (Signed 10-Feb-15 01:10)  Authored: REFERRING PHYSICIAN, Primary Care Physician, Consult, History of Present Illness, Review of Systems, PAST MEDICAL/SURGICAL HISTORY, HOME MEDICATIONS, Current Medications, ALLERGIES, NURSING VITAL SIGNS, Physical Exam-, LAB RESULTS, Recommendations   Last Updated: 10-Feb-15 01:10 by Anabel Bene (MD)

## 2014-12-12 NOTE — Discharge Summary (Signed)
PATIENT NAME:  [name], [name] Gould MR#:  [id number] DATE OF BIRTH:  1966/02/24  DATE OF ADMISSION:  09/05/2013 DATE OF DISCHARGE:  09/07/2013  DISCHARGE DIAGNOSES: 1.   Hypotension and hypoxia episode due to a lot of secretion. 2.   No source of infection identified.  3.   Seizure disorder.  4.   Down Syndrome. 5.  Altered mental status and drowsiness on presentation due to episode, back to a baseline now, stable.   MEDICATIONS ON DISCHARGE: 1.  Effexor 25 mg oral capsule extended release once a day.  2.  Donepezil 10 mg oral tablet once a day.  3.  Fluticasone 50 mcg inhalation spray each nostril once day.  4.  Keppra 500 mg oral tablet 2 times a day.  5.  Lactulose 15 mL  2 times a day.  6.  Levothyroxine 137 mcg once a day.  7.  Ranitidine 150 mg oral tablet, take 1/2 tablet 2 times a day.  8.  Quetiapine 100 mg oral tablet once a day.  9.  Trazadone 50 mg oral tablet once a day.  10.  Mucinex 600 oral tablet extended release 2 times a day as needed for cough.  11.  Glycopyrrolate 1 mg oral tablet every 12 hours.    DIET:  Regular consistency.    ACTIVITY: As tolerated.   TIMEFRAME TO FOLLOW-UP: Within 2 to 4 weeks. Advised to have routine follow-up with Dr. Darreld McleanLinda Miles, family care physician.    HISTORY OF PRESENTING ILLNESS: A 49 year old Caucasian male with history of Down syndrome, seizure disorder and respiratory failure and pneumonia was sent from nursing home because of shortness of breath. Oxygen saturation was noted to be in 70s. He received oxygen and nebulizer and oxygen saturation went to 100 after nebulizer treatment.   HOSPITAL COURSE AND STAY: The patient was hypoxic which was felt to be due to secretions. After having his oxygen and nebulizer treatment, the patient had significant improvement in his overall condition and he did not require any antibiotics.  1. Altered mental status, which was present due to episode of hypoxia and hypotension, improved.  2.  Hypotension  likely was due to hypoxic event with secretions and it improved soon.  3.  Seizure disorder, Down's syndrome, hypothyroidism. These are the patient's baseline conditions and he remained stable.   IMPORTANT LABORATORY RESULTS:  White cell count 9000, hemoglobin was 15.3 and blood culture was negative. BMP was negative. Magnesium was 2. TSH 17.6, thyroxine 3 was 0.91.   TOTAL TIME SPENT ON DISCHARGE:  35 minutes.     ____________________________ Hope PigeonVaibhavkumar G. Elisabeth PigeonVachhani, MD vgv:cc D: 09/11/2013 22:44:23 ET T: 09/12/2013 00:49:10 ET JOB#: [id number]  cc: Hope PigeonVaibhavkumar G. Elisabeth PigeonVachhani, MD, <Dictator> Leanna SatoLinda M. Miles, MD  Altamese DillingVAIBHAVKUMAR Peter Keyworth MD ELECTRONICALLY SIGNED 09/12/2013 18:58

## 2014-12-12 NOTE — H&P (Signed)
PATIENT NAME:  [name] Gould, [name] Gould MR#:  [id number] DATE OF BIRTH:  29-Jun-1966  DATE OF ADMISSION:  09/05/2013  PRIMARY CARE PHYSICIAN: Dr. Marvis Moeller.    REFERRING PHYSICIAN:  Dr. Carollee Massed.  CHIEF COMPLAINT: Shortness of breath today.   HISTORY OF PRESENT ILLNESS: A 49 year old Caucasian male with a history of Down syndrome, seizure disorder, respiratory failure and pneumonia was sent from nursing home due to shortness of breath today. The patient was noted to have shortness of breath and congestion in nursing home today and was sent to ED for further evaluation. The patient's O2 saturation was in 70s.  He was treated with oxygen and nebulizer, O2 saturation increased to 100.  In addition, the patient had hypotension with a blood pressure at 72/47, was treated with normal saline IV. Blood pressure increased to 95/56. The patient has Down syndrome and unable to provide any information.  All the information is from Dr. Carollee Massed and the patient's mother.  PAST MEDICAL HISTORY: Down syndrome, seizure disorder, hypothyroidism, acute respiratory failure, pneumonia, sepsis. The patient was admitted twice last year for acute respiratory failure. Suspect aspiration pneumonia.   FAMILY HISTORY: Unable to obtain.   SOCIAL HISTORY: No smoking or drinking or illicit drugs. Nursing home resident.    REVIEW OF SYSTEMS: Unable to obtain due to the patient's mental status .    PAST SURGICAL HISTORY: No.   ALLERGIES: DILANTIN AND SULFA DRUGS.   HOME MEDICATIONS:  1.  Ranitidine 150 mg p.o. b.i.d. 2.  Levothyroxine 88 mcg p.o. daily. 3.  Lactulose 10 grams oral powder one twice a day.  4.  Keppra 500 mg p.o. b.i.d.  5.  Hydrocort cream apply topically to affected area twice a day.  6.  Fluticasone 50 mcg inhalation 1 spray nasal twice a day.  7.  Effexor 75 mg p.o. once a day.  8.  Aricept 10 mg p.o. daily.  9.  Augmentin 1 tablet every 12 hours.   PHYSICAL EXAMINATION: VITAL SIGNS: Temperature 99.6, blood  pressure 95/56. Pulse 82, respirations 16, oxygen saturation 100% on room air.  GENERAL: The patient is weak, confused, noncommunicative. In no acute distress.  HEENT: Pupils round, equal and reactive to light. No discharge from ear or nose. Moist oral mucosa.  NECK: Supple. No JVD or carotid bruit. No lymphadenopathy. No thyromegaly.  CARDIOVASCULAR: S1, S2 regular rate and rhythm. No murmurs or gallops. PULMONARY: Bilateral air entry.  There are some rhonchi in bilateral lung fields.  No use of accessory muscle to breathe.  ABDOMEN: Soft. No distention. No tenderness. No organomegaly. Bowel sounds present.  EXTREMITIES: No edema, clubbing or cyanosis. No calf tenderness. Bilateral lower extremity contracture on the bilateral ankles.  SKIN: No rash or jaundice.  NEUROLOGIC: The patient has Down syndrome, unable to examine.   LABORATORY DATA: Urinalysis negative, glucose of 73, BUN 7, creatinine 0.9, sodium 134, potassium 3.7, chloride 102, bicarbonate 30, calcium 8.1 BNP less than 5. Troponin less than 0.02. Chest x-ray: Mild basilar atelectasis. WBC 9, hemoglobin 16.3, platelets 285. EKG showed normal sinus rhythm at 79 bpm.   IMPRESSIONS: 1.  Hypoxia, possibly due to a lot of secretion.   2.  Bibasilar atelectasis.  3.  Hypotension.  4.  Seizure disorder.  5.  Down syndrome.  6.  Hypothyroidism.   PLAN OF TREATMENT: 1.  The patient will be placed for observation.  We will continue O2 by nasal cannula, give DuoNeb every 4 hours.  2.  Start aspiration, fall and seizure precautions.  The patient has a high risk of aspiration due to a lot secretion. We will keep n.p.o. for now, but since the patient has no evidence of infection, we will not give antibiotics but follow-up CBC.  3.  We will give IV fluid support with normal saline.  4.  We will continue the patient's home medication.  5.  I discussed the patient's condition and plan of treatment with the patient's mother also I asked the  patient's mother about the patient's CODE STATUS. The patient's mother said she cannot decide but we will place for FULL CODE now until she makes a discission.   TIME SPENT: About 56 minutes    ____________________________ Shaune PollackQing Maisey Deandrade, MD qc:dp D: 09/05/2013 15:56:12 ET T: 09/05/2013 16:40:07 ET JOB#: [id number]  cc: Shaune PollackQing Krystle Polcyn, MD, <Dictator> Shaune PollackQING Joseff Luckman MD ELECTRONICALLY SIGNED 09/06/2013 12:02

## 2014-12-12 NOTE — Discharge Summary (Signed)
PATIENT NAME:  [name] Gould, [name] Gould MR#:  [id number] DATE OF BIRTH:  02-05-1966  DATE OF ADMISSION:  09/28/2013 DATE OF DISCHARGE:  10/04/2013  DISPOSITION: To [facility].   DISCHARGE DIAGNOSES: 1.  Aspiration pneumonia.  2.  Seizure disorder.  3.  Dysphagia. 4.  Down syndrome.  5.  Hypothyroidism.  DISCHARGE MEDICATIONS: 1.  Effexor XR 75 mg p.o. daily.  2.  Fluticasone 50 mcg p.o. daily.  3.  Keppra 500 mg p.o. b.i.d.  4.  Lactulose 10 grams, that is 15 mL p.o. b.i.d. for constipation.  5.  Levothyroxine 137 mcg p.o. daily.  6.  Ranitidine 150 mg p.o. b.i.d.  7.  Seroquel 100 mg p.o. daily.  8.  Trazodone 50 mg 3 tablets, that is 150 mg p.o. at bedtime.  9.  Hydrocortisone apply to affected area twice a day.  10.  Levaquin 750 mg q. 24 hours until February 18.   CONSULTATIONS: Speech therapy consult.  Neurology consult.  DIET:  Puree diet with honey-thick liquids, strict aspiration precautions, including small and single sips and bites. Breathe slowly. Must sit fully upright when taking any oral medications or diet. Meds and puree crushed as able to.  HOSPITAL COURSE: This is a 49 year old male with history of Down syndrome brought in because of unresponsiveness. The patient intubated in the Emergency Room because he was hypoxic. O2 sats were 92% on room air and also unresponsive. He was admitted to Intensive Care Unit for  acute respiratory failure and patient's chest x-ray showed all left lower lobe pneumonia. He was started on IV antibiotics and he received Levaquin and Zosyn, along with vancomycin. The patient's ABG showed pCO2 of 51 and pO2 of 65 on admission. His electrolytes were stable and white count on admission was 9.3. Blood cultures have been negative. The patient was on BiPAP and his respiratory status improved.  The patient has been afebrile and vitals are stable. He will be going to [employer] with Levaquin.   1.  Seizure disorder. The patient had Keppra  levels of 8.1 on admission and he was noticed to have multiple jerking movements, including blinking and lip smacking and the patient thought to have breakthrough seizure and seen by neurology.  The patient did receive a couple of doses of Ativan and CT head was unremarkable. The patient did not have any further seizures. He was on Keppra 500 mg p.o. b.i.d. at home. The patient did not have any seizures after the admission.  Dr.Potter  recommended to continue Keppra and he recommend to continue Dilantin as well, but the patient did not receive Dilantin during this hospitalization. He was continued on Keppra only and did receive Ativan. The patient received IV Keppra 500 mg q. 12 hours and can go back to [facility] with Keppra 500 mg q. 12 p.o.  2.  Dysphagia:. The patient does have baseline dysphagia and seen by speech therapy. The patient does have Down syndrome and started on pureed diet with honey-thick liquids. The patient tolerating the diet for now, and mother understands that if the patient continues to have aspiration pneumonia episodes, he may need a percutaneous endoscopic gastrostomy.  3.  Hypothyroidism. Continue Synthroid. 4.  The patient did receive Levaquin and Zosyn IV antibiotics for acute respiratory failure and pneumonia for 6 days.  The patient will be going to [facility] with Levaquin. The patient is from [facility] before.  But he needed a higher level of care, so he is going to SNF and  [facility] is going to accept him.   TIME SPENT:  More than 30 minutes.  CODE STATUS:  Full code.  DISCHARGE VITALS:  Heart rate 81, blood pressure 120/70, sats 93% on room air.    ____________________________ Katha HammingSnehalatha Ivyana Locey, MD sk:ce D: 10/04/2013 12:13:21 ET T: 10/04/2013 18:22:25 ET JOB#: [id number]  cc: Katha HammingSnehalatha Sandhya Denherder, MD, <Dictator> Katha HammingSNEHALATHA Deion Swift MD ELECTRONICALLY SIGNED 10/15/2013 15:42

## 2014-12-12 NOTE — H&P (Signed)
PATIENT NAME:  [name] DeutscherSLADE, [name] Gould#:  [id number] DATE OF BIRTH:  February 25, 1966  DATE OF ADMISSION:  09/28/2013  PRIMARY CARE PHYSICIAN: Darreld McleanLinda Miles, MD  HISTORY OF PRESENT ILLNESS: The patient is a 49 year old African American male with past medical history significant for history of mental retardation, questionable cerebral palsy, also Down syndrome, seizure disorder, who presented to the hospital with unresponsiveness. [facility], [facility] and he aspirated, according to caregivers, 2 days ago. He was found to be poorly responsive today, and he was sent to the Emergency Room for further evaluation. His O2 sats by EMS were noted to be 90 to 92% on room air and his blood glucose level was 142. On arrival to the Emergency Room, the patient still remains poorly responsive, and his chest x-ray reveals pneumonia. Hospitalist services were contacted for admission.   PAST MEDICAL HISTORY: Significant for history of admission in January 2015 for hypertension as well as hypoxia episode, which was felt to be due to a lot of secretions. No source of infection was identified. Also history of seizure disorder, Down syndrome, history of mental status abnormalities, altered mental status, as well as drowsiness on presentation due to hypoxia, which was felt to be due to hypoxia as well as possibly hypotension. Past medical history is also significant for history of hypothyroidism, history of respiratory failure in the past, pneumonia in the past, as well as sepsis. He was admitted at least twice for acute respiratory failure in the past, which was suspected aspiration pneumonia.   FAMILY HISTORY: Not able to obtain.   SOCIAL HISTORY: No smoking, no alcohol or illicit drugs. Nursing home resident.   REVIEW OF SYSTEMS: Is not available since the patient is poorly responsive.   MEDICATIONS: The patient's list is as follows: Levothyroxine 137 mcg p.o. daily, Flonase nasal spray 50 mcg inhalation  to each nostril daily, Effexor-XR 75 mg p.o. daily, lactulose 1 tablespoon twice daily, ranitidine 150 mg half tablet twice daily, Keppra 500 mg p.o. twice daily, Seroquel 100 mg p.o. daily at bedtime, Trazodone 50 mg 3 tablets p.o. at bedtime, Mucinex ER 600 mg p.o. twice daily as needed. Also some other medication which I cannot read, to be applied twice daily as needed for rash. for rash to be applied twice daily as needed for rash.  PHYSICAL EXAMINATION: VITAL SIGNS: On arrival to the Emergency Room, temperature was 98.7, pulse was 84, respiration rate was 16, blood pressure 121/69, and saturation was 90% on room air.  GENERAL: This is a well-developed, well-nourished African American male in moderate distress lying sideways on the stretcher. His head is turned to the right side.  HEENT: Pupils are 2 mm reactive to light. The patient has nystagmus, rotational as well as linear towards right. His eyes are deviated to the right side. He does have blinking as well as smacking of his lips during evaluation. He has intermittent jerking movements in his right upper extremity as well as left hand, fingers.  NECK: No masses, supple and nontender. Thyroid is not enlarged. No adenopathy. No JVD or carotid bruits bilaterally. Restricted range of motion. The patient does have difficulty turning his head towards the left side.  LUNGS: Rales and rhonchi bilaterally, mostly upper airway sounds. Otherwise, deeper and lower in the area on lung exam shows diminished breath sounds and somewhat labored inspirations. The patient does have gargling sound, which is audible from a distance. Does have increased effort to breathe, but no dullness to percussion.  He seems to be in mild to moderate respiratory distress.  CARDIOVASCULAR: S1 and S2 appreciated. No murmurs, gallops, or rubs noted. PMI not lateralized. Chest is nontender to palpation.  EXTREMITIES: 1+ pedal pulses. No lower extremity edema, calf tenderness, or cyanosis  was noted. 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  ABDOMEN: Soft and nontender. No hepatosplenomegaly or masses were noted.  RECTAL: Deferred.  MUSCLE STRENGTH: Not able to assess as the patient is poorly responsive. No cyanosis, degenerative joint disease. Not able to assess him for kyphosis. Gait not tested. SKIN: Did not reveal any rashes, lesions, erythema, nodularity, or induration. Somewhat indurated areas around his shins were noted; however, skin was warm and dry to palpation.  LYMPHATIC: No adenopathy in the cervical region.  NEUROLOGIC: Very difficult to obtain. The patient does have drooling and, as mentioned above, chin movements as well as blinking. His eyes are deviated. He is nonverbal. He is ankylotic and he does have clonus on exam. He is poorly alert, however, he is able to open his eyes briefly and follow with his eyes, his family member, who is calling him baby names. I am not able to assess his orientation. He is not cooperative, but no agitation was noted.  LABORATORY AND DIAGNOSTICS: BMP showed BUN of 4 otherwise unremarkable. The patient's alcohol level is less than 3, albumin level 2.5; otherwise, liver enzymes were normal. Cardiac enzymes: Troponin was less than 0.02. White blood cell count was 9.3, hemoglobin 14.5, platelet count 302, and absolute neutrophil count 7.7.   ABGs were done on room air: PH was 7.41, pCO2 51, pO2 65, bicarbonate level 32.3, saturation 95.4, and lactic acid level 1.3 on 21% FiO2.  Chest x-ray, PA and lateral, 8th of February 2015, revealed early left lower lobe pneumonia or aspiration.   ASSESSMENT AND PLAN: 1.  Acute hypercarbic respiratory failure. The patient may need to be intubated as I am very worried about his airway. He may be aspirating as we speak. However, I am worried about placing him on BiPAP due to concerns of possible vomiting and aspiration. At this point, we are going to continue oxygen therapy as needed  following his oxygen saturations and we will repeat his ABGs later today. If those are not worsening, we likely will be continuing observation only.  2.  Aspiration versus health-care associated pneumonia. Will initiate the patient on broad-spectrum antibiotic therapy with Levaquin and Zosyn as well as vancomycin and will obtain sputum cultures if possible.  3.  Seizure disorder. The patient does have some low-level seizures on presentation. We will get neurologist involved and will get Keppra level. 4.  Dysphagia. Will keep the patient n.p.o. until he is more responsive. Continue IV fluids and will get speech therapist evaluation.  5.  Hypothyroidism. Continue Synthroid intravenously.   TIME SPENT: One hour. ____________________________ Katharina Caper, MD rv:sb D: 09/28/2013 21:08:17 ET T: 09/29/2013 07:03:07 ET JOB#: [id number]  cc: Katharina Caper, MD, <Dictator> Leanna Sato, MD Matisse Roskelley MD ELECTRONICALLY SIGNED 09/29/2013 15:37

## 2014-12-12 NOTE — Discharge Summary (Signed)
PATIENT NAME:  [name] DeutscherSLADE, [name] MR#:  [id number] DATE OF BIRTH:  10-21-1965  DATE OF ADMISSION:  09/28/2013 DATE OF DISCHARGE:  10/05/2013  The patient's discharge was held on the 14th of February because he had constipation with no bowel movement. The patient did receive enemas without success. The patient was seen by me yesterday, remained asymptomatic. His abdomen was soft and bowel sounds were present. Still did not have a BM. I spoke with the Child psychotherapistsocial worker and they spoke with [facility]. The patient was discharged yesterday with stool softeners on board. He was discharged in stable condition. The patient's vitals at the time of discharge: Temperature 97.4, heart rate 79, and blood pressure 101/66. Sats are 96% on room air.   TIME SPENT: More than 30 minutes.  ____________________________ Katha HammingSnehalatha Romey Cohea, MD sk:sb D: 10/06/2013 11:40:21 ET T: 10/06/2013 13:18:11 ET JOB#: [id number]  cc: Katha HammingSnehalatha Rane Blitch, MD, <Dictator> Katha HammingSNEHALATHA Verdis Koval MD ELECTRONICALLY SIGNED 10/15/2013 16:06

## 2014-12-13 NOTE — Discharge Summary (Signed)
PATIENT NAME:  [name] Gould, [name] Gould MR#:  [id number] DATE OF BIRTH:  12-25-1965  DATE OF ADMISSION:  08/24/2011 DATE OF DISCHARGE:  08/28/2011  FINAL DIAGNOSES:  1. Acute respiratory failure, which improved.  2. Clinical sepsis with pneumonia.  3. Hypothyroidism.  4. Seizure history.  5. Down's syndrome.   MEDICATIONS ON DISCHARGE:  1. CPAP from 8:00 p.m. to 8:00 a.m.  2. Effexor 75 mg daily.  3. Lactulose 10 grams twice a day. 4. Hydrocort cream twice a day to affected area.  5. Ranitidine 150 mg twice a day.  6. Keppra 500 mg twice a day. 7. Levoxyl 88 mcg daily.  8. Flonase two sprays each nostril daily.  9. Aricept 10 mg daily. Prednisone taper as written on prescription until completion.  10. Augmentin 875 mg p.o. twice a day until completion.  11. Zithromax 250 mg p.o. daily until completion.   DIET: Low sodium diet.   ACTIVITY: Activity as tolerated.   FOLLOW-UP: Follow-up at the [facility].   REASON FOR ADMISSION: The patient was admitted 08/24/2011 and discharge 08/28/2011. Came in with cough, chest congestion, pneumonia and hypoxia.   HISTORY OF PRESENT ILLNESS: 49 year old man with history of seizure disorder, Down's syndrome, and hypothyroidism who came in from a group home with cough and chest congestion. pO2 was 58. Chest x-ray showed bilateral pneumonia. In the ER he was given azithromycin and Rocephin but since he came in from a group home, he was switched to vancomycin and Zosyn. He was given oxygen supplementation.   LABORATORY, DIAGNOSTIC, AND RADIOLOGICAL DATA: EKG showed sinus tachycardia, nonspecific ST-T wave changes. Chest x-ray showed patchy bibasilar infiltrates compatible with pneumonia. ABG showed pH of 7.41, pCO2 of 43, pO2 of 58, bicarbonate 27.3, oxygen saturation 90.0. One blood culture grew out coag negative staph which is a contaminant of skin organism. Troponin negative. Glucose 146, BUN 10, creatinine 0.88, sodium 143, potassium 3.8, chloride  103, CO2 29, calcium 7.9, albumin 2.8. White blood cell count 14.4, hemoglobin 14, hematocrit 43, platelet count 201. BNP 14. Cardiac enzymes negative. Hemoglobin A1c 5.6. Urinalysis negative. Ultrasound of the lower extremities showed no deep vein thrombosis. Influenza A and B were negative. White count normalized on 01/05 to 7.6. Oxygen saturation on 90% in the a.m. and 96% by me around 12 noon on the day of discharge on room air.   HOSPITAL COURSE BY PROBLEM LIST:  1. Acute respiratory failure. This was secondary to pneumonia. He required oxygen supplementation during the hospital stay as high as 3 liters nasal cannula. This had improved to pulse oximetry of 96% on room air prior to discharge.  2. For the patient's clinical sepsis with leukocytosis and fever, he was found to have bilateral pneumonia. He was given Rocephin and Zithromax in the Emergency Room that was switched to Zosyn and vancomycin by the admitting physician since he came from a facility. I added Zithromax back to cover atypicals. The vancomycin was discontinued when the blood cultures were negative. The patient continued to improve. The patient is being sent home on Zithromax and Augmentin. The patient did most of the improvement once Solu-Medrol was added. Lungs were clear upon discharge and a prednisone taper was given upon discharge.  3. For his hypothyroidism, he is on Levoxyl.  4. For his seizure history, he is on Keppra.  5. For his Down's syndrome, he is on medications for mood including Effexor and Aricept.  TIME SPENT:  Time spent on discharge was 40 minutes.  ____________________________  Croy Drumwright J. Renae GlossWieting, MD rjw:rbg D: 08/28/2011 15:39:59 ET T: 08/29/2011 14:06:53 ET  JOB#: [id number]  cc: [facility]les [facility] Salley ScarletICHARD J Dearis Danis MD ELECTRONICALLY SIGNED 09/10/2011 13:32

## 2014-12-13 NOTE — Discharge Summary (Signed)
PATIENT NAME:  [name] Gould, [name] Gould DATE OF BIRTH:  Mar 17, 1966  DATE OF ADMISSION:  02/27/2012 DATE OF DISCHARGE:  03/01/2012  PRIMARY CARE PHYSICIAN: Darreld McleanLinda Miles, [name] Gould  DISCHARGE DIAGNOSES:  1. Acute respiratory failure likely due to aspiration pneumonia. 2. Metabolic encephalopathy.  3. Hypokalemia.  4. Seizure disorder.   HOME MEDICATIONS: Please see medication list.  DIET: Regular, mechanical soft with no straws.   ACTIVITY: As tolerated.   FOLLOWUP CARE: Followup with PCP within one week.   HOSPITAL COURSE: The patient is a 49 year old African American male with a history of Down's syndrome, mental retardation, who presented to the ED with shortness of breath as well as congestion in the chest and cough. The patient's temperature was 100 in the ED. Chest x-ray showed increased interstitial density in both lungs. ABG showed a pH of 7.39, pCO2 50, pO2 51, oxygen saturation 85.3%, and lactic acid 1.2. Urinalysis was negative. CBC was within normal limits. The patient was admitted for acute respiratory failure possibly due to pneumonia. After admission the patient has been treated with Zithromax, Zosyn and oxygen by nasal cannula. Since the patient has seizure disorder, the patient has been placed on Keppra. The patient had one episode of body shaking during hospitalization, but it was not a typical seizure. The patient's altered mental status and encephalopathy is likely due to metabolic and has now returned to the patient's baseline. The patient is awake, alert, and in no acute distress, but demented, not communicative.   Vital signs today include temperature 98.8, blood pressure 111/76, pulse 98, and oxygen saturation 98%. Physical examination is unremarkable. Please see progress note.   Labs show potassium 3.0 and magnesium 1.8. CBC is normal.   Since the patient is clinically stable, the patient will be discharged back to the group home today. I discussed the patient's  discharge plan with the nurse and the case manager.   TIME SPENT: About 35 minutes.  ____________________________ [name] PollackQing Rilynne Lonsway, [name] Gould qc:slb D: 03/01/2012 14:30:19 ET T: 03/01/2012 14:51:17 ET JOB#: [id number]  cc: [name] PollackQing Nisa Decaire, [name] Gould, <Dictator> Leanna SatoLinda M. Miles, [name] Gould [name] PollackQING Maddalyn Lutze [name] Gould ELECTRONICALLY SIGNED 03/01/2012 15:41
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
## 2014-12-13 NOTE — H&P (Signed)
PATIENT NAME:  [name] Gould, [name] Gould MR#:  [id number] DATE OF BIRTH:  01/09/1966  DATE OF ADMISSION:  08/24/2011  ADDENDUM  ASSESSMENT AND PLAN:   Bilateral lower extremity swelling - given the patient's sedentary lifestyle, we will obtain venous Doppler ultrasound to assess for possible underlying deep vein thrombosis. If no DVT is noted, we will place him on SCDs and in the meanwhile will keep him on Lovenox for deep vein thrombosis prophylaxis. ____________________________ Elon AlasKamran N. Aldena Worm, MD knl:slb D: 08/24/2011 20:24:33 ET T: 08/25/2011 07:38:53 ET JOB#: [id number]  cc: Elon AlasKamran N. Maxwel Meadowcroft, MD, <Dictator> [facility] Scotty CourtKAMRAN N Merci Walthers MD ELECTRONICALLY SIGNED 09/07/2011 18:55

## 2014-12-13 NOTE — Consult Note (Signed)
PATIENT NAME:  [name] Gould, WIEDEMAN MR#:  [id number] DATE OF BIRTH:  05-Apr-1966  DATE OF CONSULTATION:  02/29/2012  REFERRING PHYSICIAN:  Dr. Imogene Burn CONSULTING PHYSICIAN:  Rose Phi. Kemper Durie, MD  HISTORY:  Mr. 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 He was admitted 02/27/2012 with acute respiratory failure and is referred for evaluation of possible seizure activity.  History comes from telephone conversation with his primary caregiver at [facility] [phone]) and from his hospital chart and his [facility] hospital records.   The patient was brought to the Emergency Room at 11:00 a.m. on 02/27/2012, sent by [name] staff with concern regarding possible pneumonia in the setting of two-day history of progressive problems with congestion, coughing, and then onset of wheezing. He was referred for possible seizure activity when he was noted by rounding physician 02/28/2012 to have "body shaking and tremor".   He had infrequent seizures starting 04/29/2004 for which his mother declined recommended initiation of Keppra treatment. He was seen in the Emergency Room December 2008 for seizure and was then started on Dilantin. He was admitted to [facility] January 2009 because of severe skin rash concluded secondary to reaction to Dilantin. Dilantin was stopped and he was started on Keppra. Since then he has been on Keppra 500 mg twice a day. His primary care center caregiver reports that on this dose, he has had rare seizures, last seizure spell more than two years ago. She reports that at his baseline, he has had easy startle, has generally fairly frequent small jerks of  limbs, and can have jerks of hands or legs with passive or active movements, really not without significant change since first coming there in 2005.  At his baseline, he does not walk or feed himself and responds to address with attention but has little or no speech.   EXAMINATION: The patient is a well nourished gentleman examined lying semisupine, in no apparent distress with Down's syndrome physiognomy, with blood pressure 105/70 and heart rate 92. Nasal cannula oxygen was in place and he had IV fluid flowing. He was found initially lying with eyes closed, apparently asleep, and easily aroused to address with small startle. He attended to the examiner but had speech limited to single syllable or sound. He did not follow commands. He appeared in no apparent distress but had small myoclonic jerk reaction to range of motion testing of the extremities by the examiner.  Tone was symmetric and increased throughout, more in the legs than the arms. He had no tremor at rest.   IMPRESSION:  1. History of easy startle and myoclonic jerks for several years, associated progressive cognitive decline superimposed on chronic significant retardation, consistent with progressive dementia and Down's syndrome.  2. History of epilepsy controlled with rare seizures on Keppra 500 mg twice a day.  3. Not noted above, TSH on admission was elevated at 78.9.  He has been started on thyroid replacement medication in the hospital. It is not clear how long he may have had elevated TSH.   RECOMMENDATIONS: I agree with his present work-up and treatment in hospital including imaging and laboratory studies and for treatment of respiratory failure.  Recommend he continue on Keppra 500 mg twice a day.  ____________________________ Rose PhiPeter R. Kemper Durielarke, MD prc:bjt D: 02/29/2012 14:28:12 ET T: 02/29/2012 15:13:38 ET JOB#: [id number]  cc: Rose PhiPeter R. Kemper Durielarke, MD, <Dictator> Gaspar GarbePETER R [name] MD ELECTRONICALLY SIGNED 03/28/2012 15:15
